# Patient Record
Sex: Female | Born: 1960 | Race: Black or African American | Hispanic: No | Marital: Married | Smoking: Never smoker
Health system: Southern US, Community
[De-identification: ages and names within clinical notes are randomized; demographics above are authoritative.]

## PROBLEM LIST (undated history)

## (undated) DIAGNOSIS — I10 Essential (primary) hypertension: Secondary | ICD-10-CM

---

## 2013-06-22 ENCOUNTER — Other Ambulatory Visit: Payer: Self-pay | Admitting: Otolaryngology

## 2013-06-22 DIAGNOSIS — D1039 Benign neoplasm of other parts of mouth: Secondary | ICD-10-CM

## 2013-06-30 ENCOUNTER — Ambulatory Visit
Admission: RE | Admit: 2013-06-30 | Discharge: 2013-06-30 | Disposition: A | Discharge status: Home / Self Care | Payer: BC Managed Care – PPO | Source: Ambulatory Visit | Attending: Otolaryngology | Admitting: Otolaryngology

## 2013-06-30 DIAGNOSIS — D1039 Benign neoplasm of other parts of mouth: Secondary | ICD-10-CM

## 2013-06-30 MED ORDER — IOHEXOL 300 MG/ML  SOLN
75.0000 mL | Freq: Once | INTRAMUSCULAR | Status: AC | PRN
  Administered 2013-06-30: 75 mL via INTRAVENOUS

## 2013-09-26 ENCOUNTER — Emergency Department (HOSPITAL_BASED_OUTPATIENT_CLINIC_OR_DEPARTMENT_OTHER)
Admission: EM | Admit: 2013-09-26 | Discharge: 2013-09-26 | Disposition: A | Discharge status: Home / Self Care | Payer: BC Managed Care – PPO | Attending: Emergency Medicine | Admitting: Emergency Medicine

## 2013-09-26 ENCOUNTER — Encounter (HOSPITAL_BASED_OUTPATIENT_CLINIC_OR_DEPARTMENT_OTHER): Payer: Self-pay | Admitting: Emergency Medicine

## 2013-09-26 DIAGNOSIS — I10 Essential (primary) hypertension: Secondary | ICD-10-CM | POA: Insufficient documentation

## 2013-09-26 DIAGNOSIS — Z79899 Other long term (current) drug therapy: Secondary | ICD-10-CM | POA: Insufficient documentation

## 2013-09-26 DIAGNOSIS — H11429 Conjunctival edema, unspecified eye: Secondary | ICD-10-CM

## 2013-09-26 DIAGNOSIS — H5789 Other specified disorders of eye and adnexa: Secondary | ICD-10-CM | POA: Insufficient documentation

## 2013-09-26 HISTORY — DX: Essential (primary) hypertension: I10

## 2013-09-26 MED ORDER — FLUORESCEIN SODIUM 1 MG OP STRP
ORAL_STRIP | OPHTHALMIC | Status: AC
  Filled 2013-09-26: qty 1

## 2013-09-26 MED ORDER — HYDROXYZINE HCL 25 MG PO TABS: 25.0000 mg | ORAL_TABLET | Freq: Four times a day (QID) | ORAL | Status: AC

## 2013-09-26 MED ORDER — ERYTHROMYCIN 5 MG/GM OP OINT: TOPICAL_OINTMENT | OPHTHALMIC | Status: AC

## 2013-09-26 MED ORDER — TETRACAINE HCL 0.5 % OP SOLN
OPHTHALMIC | Status: AC
  Filled 2013-09-26: qty 2

## 2013-09-26 NOTE — ED Provider Notes (Signed)
CSN: [id number]     Arrival date & time 09/26/13  1044 History   First MD Initiated Contact with Patient 09/26/13 1108     Chief Complaint  Patient presents with  . Eye Pain     (Consider location/radiation/quality/duration/timing/severity/associated sxs/prior Treatment) HPI  This a 53 year old female with history of hypertension who presents with left eye pain. Patient states that she was just taking any roots course this morning when she felt like something flew into her left eye. She noted swelling and pain to that eye. She states that she washed her eye out with water and got better. She has noted redness. No drainage. She denies any decreased visual acuity or blurry vision.  Past Medical History  Diagnosis Date  . Hypertension    History reviewed. No pertinent past surgical history. No family history on file. History  Substance Use Topics  . Smoking status: Never Smoker   . Smokeless tobacco: Not on file  . Alcohol Use: Not on file   OB History   Grav Para Term Preterm Abortions TAB SAB Ect Mult Living                 Review of Systems  Eyes: Positive for pain and redness. Negative for photophobia and visual disturbance.  Skin: Negative for wound.  Neurological: Negative for headaches.  All other systems reviewed and are negative.     Allergies  Iron  Home Medications   Prior to Admission medications   Medication Sig Start Date End Date Taking? Authorizing Provider  metoprolol succinate (TOPROL-XL) 100 MG 24 hr tablet Take 100 mg by mouth daily. Take with or immediately following a meal.   Yes Historical Provider, MD  erythromycin ophthalmic ointment Place a 1/2 inch ribbon of ointment into the lower eyelid. 09/26/13   Merryl Hacker, MD  hydrOXYzine (ATARAX/VISTARIL) 25 MG tablet Take 1 tablet (25 mg total) by mouth every 6 (six) hours. 09/26/13   Merryl Hacker, MD   BP 165/86  Pulse 51  Temp(Src) 97.8 F (36.6 C) (Oral)  Resp 16  SpO2 100% Physical  Exam  Nursing note and vitals reviewed. Constitutional: She is oriented to person, place, and time. She appears well-developed and well-nourished.  HENT:  Head: Normocephalic and atraumatic.  Eyes: EOM are normal. Pupils are equal, round, and reactive to light.  Injected left conjunctiva with chemosis, pupils 5 mm reactive bilaterally, no corneal abrasion noted on the floor seen exam, no foreign body noted with lid inversion  Cardiovascular: Normal rate and regular rhythm.   Pulmonary/Chest: Effort normal. No respiratory distress.  Neurological: She is alert and oriented to person, place, and time.  Skin: Skin is warm and dry.  Psychiatric: She has a normal mood and affect.    ED Course  Procedures (including critical care time) Labs Review Labs Reviewed - No data to display  Imaging Review No results found.   EKG Interpretation None      MDM   Final diagnoses:  Chemosis   Patient presents with left eye pain. She suspects something flew into her earlier. She has conjunctival injection and chemosis with mild swelling noted to the eye. Patient reports improvement of pain and there is no foreign body noted on exam. She may have had an allergic response to whatever may have been in her eye. Discuss with patient that while she is no evidence of corneal abrasion, would conservatively treat with erythromycin and Atarax. Patient stated understanding.  After history, exam, and medical  workup I feel the patient has been appropriately medically screened and is safe for discharge home. Pertinent diagnoses were discussed with the patient. Patient was given return precautions.       Merryl Hacker, MD 09/26/13 1145

## 2013-09-26 NOTE — ED Notes (Signed)
Patient here with left eye pain x 1 hour. Was outside when she had sudden pain, unsure if something flew in same.

## 2013-09-26 NOTE — Discharge Instructions (Signed)
You were evaluated for a left eye pain. While there is no significant evidence of corneal abrasion he did have some chemosis and reaction to the eyes suggesting prior for her body. Will treat you with antihistamine and antibiotic ointment. Below are precautions regarding corneal abrasions.  Corneal Abrasion The cornea is the clear covering at the front and center of the eye. When looking at the colored portion of the eye (iris), you are looking through the cornea. This very thin tissue is made up of many layers. The surface layer is a single layer of cells (corneal epithelium) and is one of the most sensitive tissues in the body. If a scratch or injury causes the corneal epithelium to come off, it is called a corneal abrasion. If the injury extends to the tissues below the epithelium, the condition is called a corneal ulcer. CAUSES   Scratches.  Trauma.  Foreign body in the eye. Some people have recurrences of abrasions in the area of the original injury even after it has healed (recurrent erosion syndrome). Recurrent erosion syndrome generally improves and goes away with time. SYMPTOMS   Eye pain.  Difficulty or inability to keep the injured eye open.  The eye becomes very sensitive to light.  Recurrent erosions tend to happen suddenly, first thing in the morning, usually after waking up and opening the eye. DIAGNOSIS  Your health care provider can diagnose a corneal abrasion during an eye exam. Dye is usually placed in the eye using a drop or a small paper strip moistened by your tears. When the eye is examined with a special light, the abrasion shows up clearly because of the dye. TREATMENT   Small abrasions may be treated with antibiotic drops or ointment alone.  Usually a pressure patch is specially applied. Pressure patches prevent the eye from blinking, allowing the corneal epithelium to heal. A pressure patch also reduces the amount of pain present in the eye during healing. Most  corneal abrasions heal within 2 3 days with no effect on vision. If the abrasion becomes infected and spreads to the deeper tissues of the cornea, a corneal ulcer can result. This is serious because it can cause corneal scarring. Corneal scars interfere with light passing through the cornea and cause a loss of vision in the involved eye. HOME CARE INSTRUCTIONS  Use medicine or ointment as directed. Only take over-the-counter or prescription medicines for pain, discomfort, or fever as directed by your health care provider.  Do not drive or operate machinery while your eye is patched. Your ability to judge distances is impaired.  If your health care provider has given you a follow-up appointment, it is very important to keep that appointment. Not keeping the appointment could result in a severe eye infection or permanent loss of vision. If there is any problem keeping the appointment, let your health care provider know. SEEK MEDICAL CARE IF:   You have pain, light sensitivity, and a scratchy feeling in one eye or both eyes.  Your pressure patch keeps loosening up, and you can blink your eye under the patch after treatment.  Any kind of discharge develops from the eye after treatment or if the lids stick together in the morning.  You have the same symptoms in the morning as you did with the original abrasion days, weeks, or months after the abrasion healed. MAKE SURE YOU:   Understand these instructions.  Will watch your condition.  Will get help right away if you are not doing well  or get worse. Document Released: 05/10/2000 Document Revised: 03/03/2013 Document Reviewed: 01/18/2013 [facility] Patient Information 2014 [place].

## 2015-01-05 ENCOUNTER — Other Ambulatory Visit: Payer: Self-pay | Admitting: Otolaryngology

## 2015-01-05 DIAGNOSIS — M6740 Ganglion, unspecified site: Secondary | ICD-10-CM

## 2015-03-10 ENCOUNTER — Ambulatory Visit
Admission: RE | Admit: 2015-03-10 | Discharge: 2015-03-10 | Disposition: A | Discharge status: Home / Self Care | Payer: BC Managed Care – PPO | Source: Ambulatory Visit | Attending: Otolaryngology | Admitting: Otolaryngology

## 2015-03-10 DIAGNOSIS — M6740 Ganglion, unspecified site: Secondary | ICD-10-CM

## 2015-03-10 MED ORDER — IOPAMIDOL (ISOVUE-300) INJECTION 61%
75.0000 mL | Freq: Once | INTRAVENOUS | Status: AC | PRN
  Administered 2015-03-10: 75 mL via INTRAVENOUS

## 2017-08-26 ENCOUNTER — Other Ambulatory Visit: Payer: Self-pay | Admitting: Otolaryngology

## 2017-08-26 DIAGNOSIS — D1039 Benign neoplasm of other parts of mouth: Secondary | ICD-10-CM

## 2017-09-19 ENCOUNTER — Ambulatory Visit
Admission: RE | Admit: 2017-09-19 | Discharge: 2017-09-19 | Disposition: A | Discharge status: Home / Self Care | Payer: BC Managed Care – PPO | Source: Ambulatory Visit | Attending: Otolaryngology | Admitting: Otolaryngology

## 2017-09-19 DIAGNOSIS — D1039 Benign neoplasm of other parts of mouth: Secondary | ICD-10-CM

## 2017-09-19 MED ORDER — GADOBENATE DIMEGLUMINE 529 MG/ML IV SOLN
12.0000 mL | Freq: Once | INTRAVENOUS | Status: AC | PRN
  Administered 2017-09-19: 12 mL via INTRAVENOUS

## 2019-03-04 IMAGING — MR MR NECK SOFT TISSUE ONLY WO/W CM
10 series · 46 of 48 positions shown · IV contrast (12ml Multihance)
Comparison: MRI report 02/26/2013, no prior images for comparison.
CT face 03/10/2015

CLINICAL DATA: Left hard palate adenoma resected 6416. Rule out
recurrence.

EXAM:
MRI OF THE NECK WITH CONTRAST
TECHNIQUE: Multiplanar, multisequence MR imaging was performed following the
administration of intravenous contrast.
CONTRAST:  12mL MULTIHANCE GADOBENATE DIMEGLUMINE 529 MG/ML IV SOLN

[Series 2: T1 · sagittal · 5.0mm · 0.75mm/px · 3 of 22 slices shown (1 of 3)]
[im 1/22]
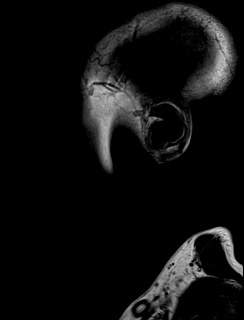
[im 11/22]
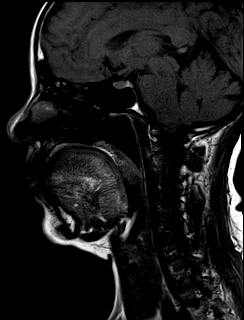
[im 22/22]
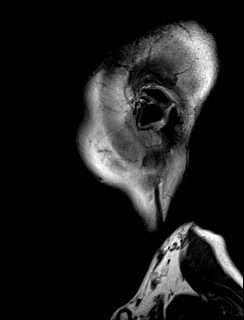

[Series 3: T1 · coronal · 4.0mm · 0.75mm/px · 4 of 30 slices shown (2 of 3)]
[im 1/30]
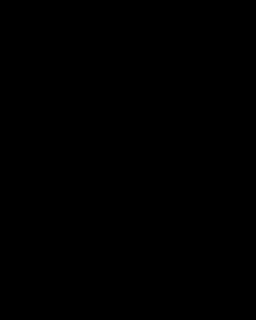
[im 10/30]
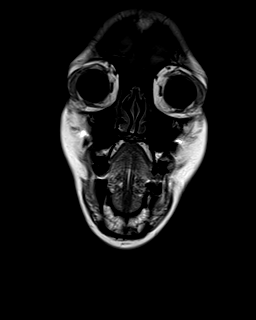
[im 20/30]
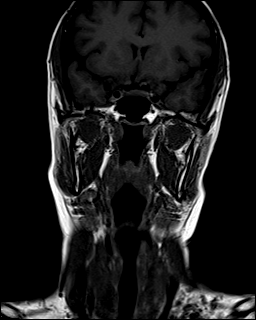
[im 30/30]
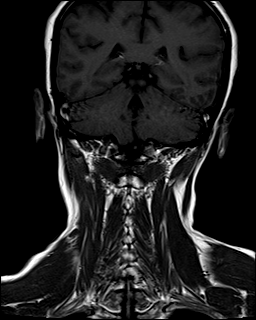

[Series 4: T1 · axial · 4.0mm · 0.75mm/px · z∈[-44,+108]mm · 5 of 33 slices shown (3 of 3)]
[im 1/33]
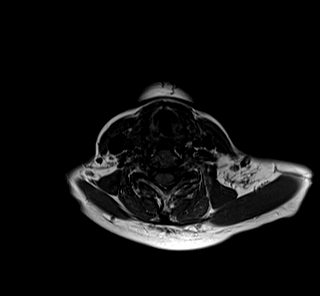
[im 9/33]
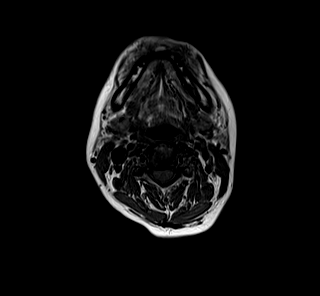
[im 17/33]
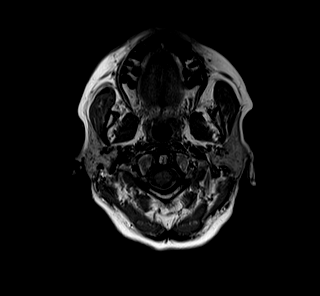
[im 25/33]
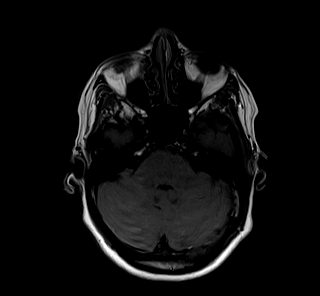
[im 33/33]
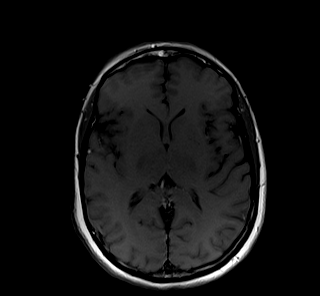

[Series 5: T2 fat-sat · sagittal · 5.0mm · 0.47mm/px · 4 of 22 slices shown (1 of 2)]
[im 1/22]
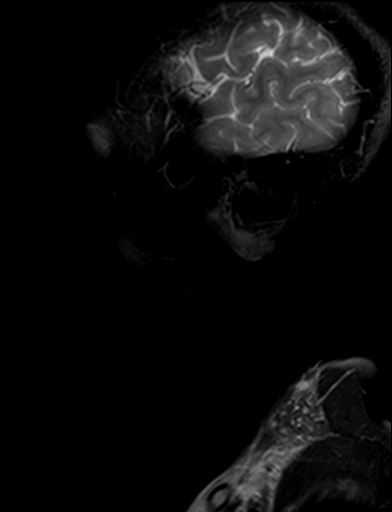
[im 8/22]
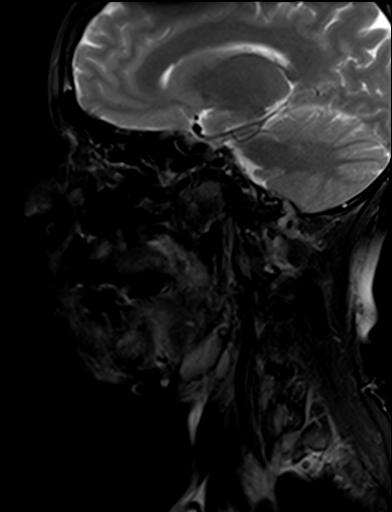
[im 15/22]
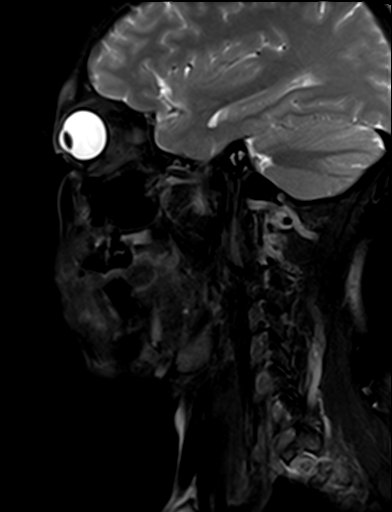
[im 22/22]
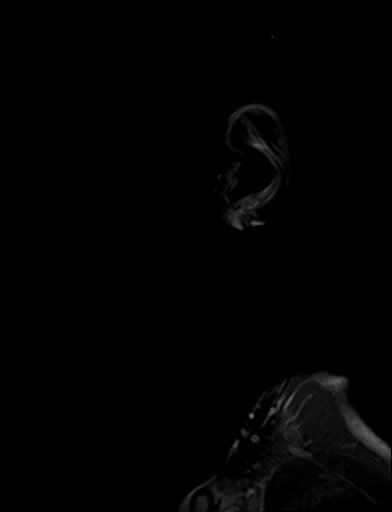

[Series 6: STIR · coronal · 4.0mm · 0.94mm/px · 5 of 30 slices shown]
[im 1/30]
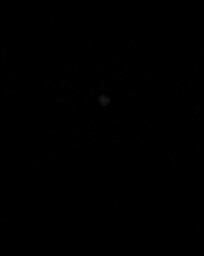
[im 8/30]
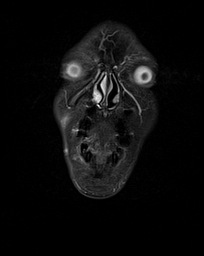
[im 15/30]
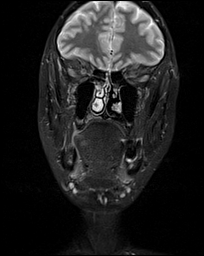
[im 22/30]
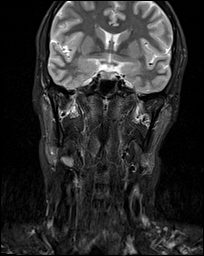
[im 30/30]
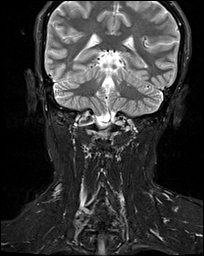

[Series 7: T2 fat-sat · axial · 4.0mm · 0.94mm/px · z∈[-44,+108]mm · 6 of 33 slices shown (2 of 2)]
[im 1/33]
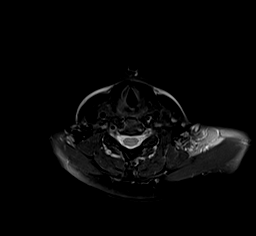
[im 7/33]
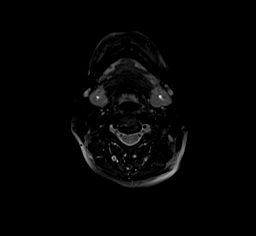
[im 13/33]
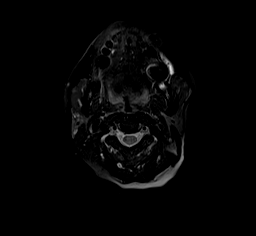
[im 20/33]
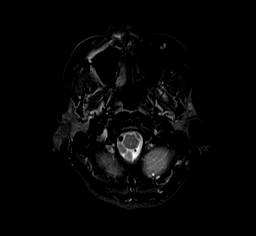
[im 26/33]
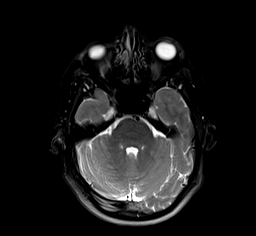
[im 33/33]
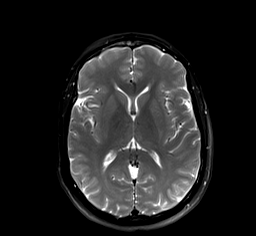

[Series 8: T1 fat-sat · axial · 4.0mm · 0.75mm/px · z∈[-44,+108]mm · 6 of 33 slices shown]
[im 1/33]
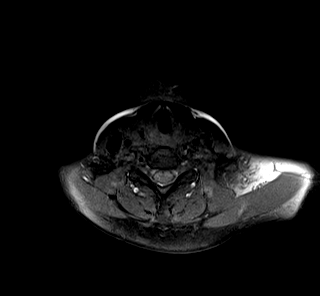
[im 7/33]
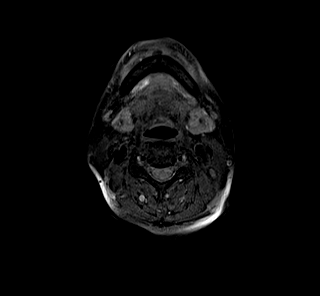
[im 13/33]
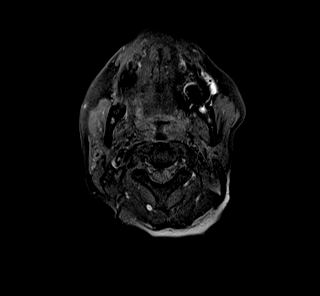
[im 20/33]
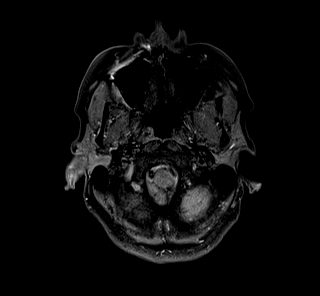
[im 26/33]
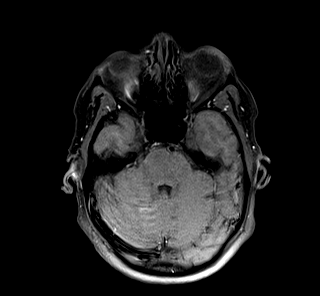
[im 33/33]
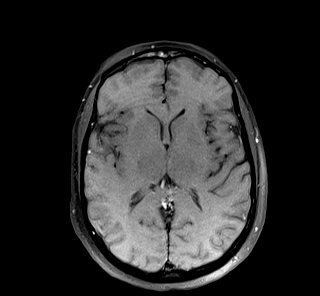

[Series 9: post fs axial · axial · 4.0mm · 0.75mm/px · z∈[-44,+108]mm · 6 of 33 slices shown]
[im 1/33]
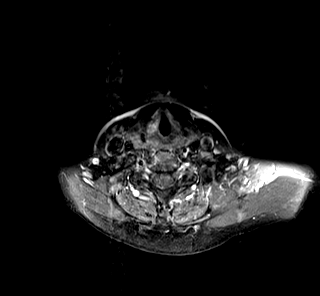
[im 7/33]
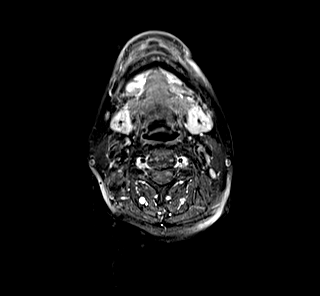
[im 13/33]
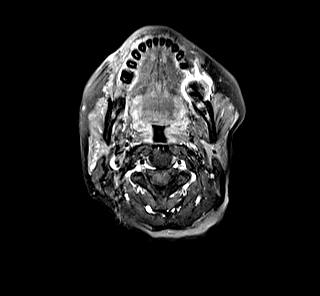
[im 20/33]
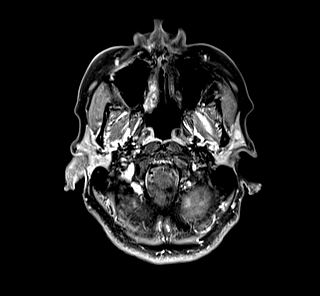
[im 26/33]
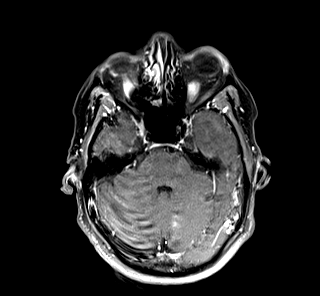
[im 33/33]
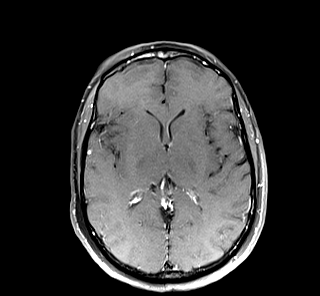

[Series 10: post fs sag · sagittal · 5.0mm · 0.75mm/px · 4 of 22 slices shown]
[im 1/22]
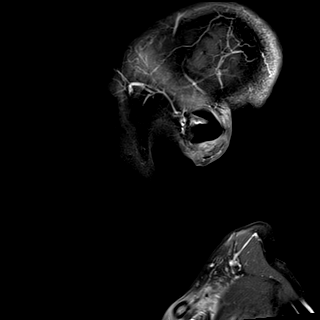
[im 8/22]
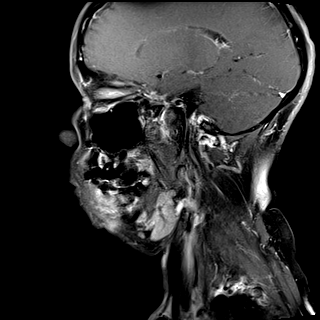
[im 15/22]
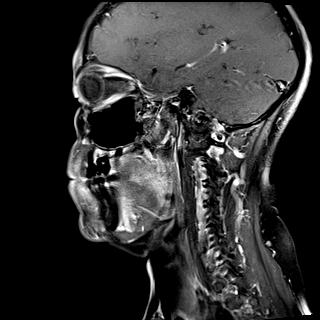
[im 22/22]
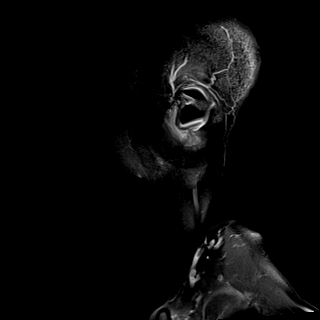

[Series 11: post fs cor · coronal · 4.0mm · 0.47mm/px · 3 of 30 slices shown]
[im 1/30]
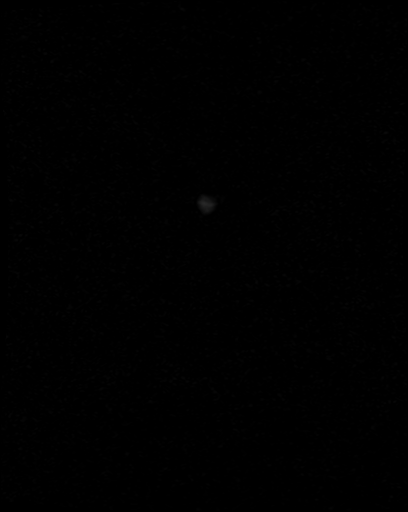
[im 8/30]
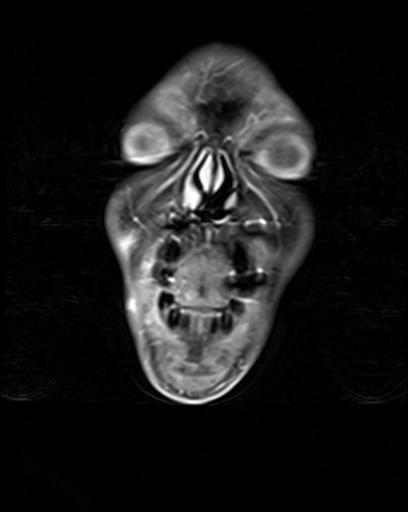
[im 15/30]
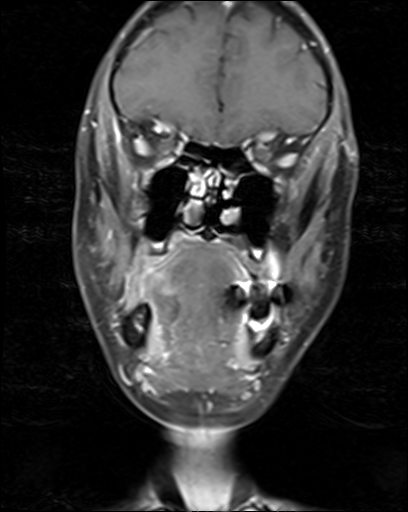

[46 of 48 positions shown; findings below may reference images not displayed]

FINDINGS: Pharynx and larynx: Negative for pharyngeal mass. Prior resection of
tumor involving the left hard palate. No recurrent tumor identified.
No tumor in the nasal cavity.

Salivary glands: Negative

Thyroid: Not evaluated on the study

Lymph nodes: No pathologic lymph nodes in the neck.

Vascular: Normal arterial flow voids

Limited intracranial: Negative

Visualized orbits: Negative

Mastoids and visualized paranasal sinuses: Negative

Skeleton: Negative

Upper chest: Not evaluated

Other: None
IMPRESSION: Postop resection of tumor involving the left hard palate. No
recurrent mass identified in the palate or nasal cavity. No
adenopathy in the neck.
